# Patient Record
Sex: Female | Born: 1995 | Race: White | Hispanic: No | Marital: Single | Smoking: Never smoker
Health system: Southern US, Community
[De-identification: ages and names within clinical notes are randomized; demographics above are authoritative.]

## PROBLEM LIST (undated history)

## (undated) DIAGNOSIS — F1010 Alcohol abuse, uncomplicated: Secondary | ICD-10-CM

## (undated) DIAGNOSIS — F319 Bipolar disorder, unspecified: Secondary | ICD-10-CM

## (undated) HISTORY — DX: Bipolar disorder, unspecified: F31.9

## (undated) HISTORY — DX: Alcohol abuse, uncomplicated: F10.10

---
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
## 2013-01-26 ENCOUNTER — Encounter: Payer: Self-pay | Admitting: Internal Medicine

## 2013-01-28 ENCOUNTER — Ambulatory Visit (INDEPENDENT_AMBULATORY_CARE_PROVIDER_SITE_OTHER): Payer: No Typology Code available for payment source | Admitting: Adult Health

## 2013-01-28 ENCOUNTER — Encounter: Payer: Self-pay | Admitting: Adult Health

## 2013-01-28 VITALS — BP 110/62 | HR 72 | Temp 98.3°F | Resp 12 | Wt 123.0 lb

## 2013-01-28 DIAGNOSIS — R05 Cough: Secondary | ICD-10-CM

## 2013-01-28 DIAGNOSIS — R059 Cough, unspecified: Secondary | ICD-10-CM | POA: Insufficient documentation

## 2013-01-28 MED ORDER — ALBUTEROL SULFATE HFA 108 (90 BASE) MCG/ACT IN AERS: 2.0000 | INHALATION_SPRAY | Freq: Four times a day (QID) | RESPIRATORY_TRACT | Status: AC | PRN

## 2013-01-28 MED ORDER — FEXOFENADINE HCL 30 MG/5ML PO SUSP: ORAL | Status: AC

## 2013-01-28 MED ORDER — GUAIFENESIN-CODEINE 100-10 MG/5ML PO SYRP: 5.0000 mL | ORAL_SOLUTION | Freq: Three times a day (TID) | ORAL | Status: AC | PRN

## 2013-01-28 NOTE — Assessment & Plan Note (Signed)
Robitussin AC 1 tsp 3 times/daily; Allegra elixir 60 mg bid; Albuterol inhaler q 6 hours prn for wheezing, cough or sob.

## 2013-01-28 NOTE — Patient Instructions (Signed)
  I am ordering Robitussin AC (codeine) for severe cough. You can take this 3 times a day.   Use the inhaler every 6 hours for the next 2 days then just do the inhaler as needed for cough, shortness of breath or wheezing.  Take allegra 2 teaspoons (60 mg) twice a day.   If your symptoms are not better by Monday, call the office Tuesday morning to try to get an appointment to be seen.

## 2013-01-28 NOTE — Progress Notes (Signed)
  Subjective:    Patient ID: Kristy Robertson, female    DOB: [birth year], 17 y.o.   MRN: [id number]  HPI  Patient presents with cough x 4-5 days, worse at night. Patient reports that she was recently started on Lamictal and she knows that this has a side effect of increased cough. She also reports that she was prescribed an inhaler when she was younger for "just in case". 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  Patient has been taking children's benadryl and this seems to calm it down. She reports her cough has never been this bad.   Current Outpatient Prescriptions on File Prior to Visit  Medication Sig Dispense Refill  . lamoTRIgine (LAMICTAL) 25 MG tablet Take 1 tablet (25 mg total) by mouth daily. One tablet daily in the evening,  Increase dose weekly by 25 mg  120 tablet  1  . SUMAtriptan (IMITREX) 50 MG tablet Take 1 tab by mouth at onset pf migraine.  9 tablet  5   No current facility-administered medications on file prior to visit.     Review of Systems  Constitutional: Negative for fever and chills.  Respiratory: Positive for cough.      BP 110/62  Pulse 72  Temp(Src) 98.3 F (36.8 C) (Oral)  Resp 12  Wt 123 lb (55.792 kg)  BMI 21.29 kg/m2  SpO2 98%    Objective:   Physical Exam  Constitutional: She is oriented to person, place, and time. She appears well-developed and well-nourished. No distress.  HENT:  Head: Normocephalic and atraumatic.  Right Ear: External ear normal.  Left Ear: External ear normal.  Scant posterior pharyngeal drainage  Cardiovascular: Normal rate, regular rhythm and normal heart sounds.  Exam reveals no gallop and no friction rub.   No murmur heard. Pulmonary/Chest: Effort normal and breath sounds normal. No respiratory distress. She has no wheezes. She has no rales.  Neurological: She is alert and oriented to person, place, and time.  Skin: Skin is warm and dry.  Psychiatric: She has a normal mood and affect. Her behavior is  normal. Judgment and thought content normal.          Assessment & Plan:

## 2013-02-23 ENCOUNTER — Other Ambulatory Visit: Payer: Self-pay | Admitting: Internal Medicine

## 2013-02-23 NOTE — Telephone Encounter (Signed)
Find out what dose of lamictal hse is currently taking  The pill is 25 but was supposed to be titrated up to 100 mg over time.

## 2013-02-23 NOTE — Telephone Encounter (Signed)
Patient stated she is taking 50 mg .

## 2013-02-23 NOTE — Telephone Encounter (Signed)
Left message for pt to return my call.

## 2013-02-23 NOTE — Telephone Encounter (Signed)
Refill

## 2013-03-03 MED ORDER — LAMOTRIGINE 25 MG PO TABS: 50.0000 mg | ORAL_TABLET | Freq: Every day | ORAL | Status: DC

## 2013-03-03 NOTE — Addendum Note (Signed)
Addended by: Sherlene Shams on: 03/03/2013 06:48 AM   Modules accepted: Orders

## 2013-04-13 ENCOUNTER — Encounter: Payer: Self-pay | Admitting: Internal Medicine

## 2013-05-07 ENCOUNTER — Encounter: Payer: Self-pay | Admitting: Internal Medicine

## 2013-05-08 MED ORDER — LAMOTRIGINE 25 MG PO TABS: 50.0000 mg | ORAL_TABLET | Freq: Every day | ORAL | Status: AC

## 2013-06-13 ENCOUNTER — Other Ambulatory Visit: Payer: Self-pay | Admitting: Internal Medicine

## 2013-06-15 NOTE — Telephone Encounter (Signed)
Eprescribed.

## 2013-07-16 ENCOUNTER — Other Ambulatory Visit: Payer: Self-pay

## 2013-08-18 ENCOUNTER — Encounter: Payer: Self-pay | Admitting: Internal Medicine

## 2013-08-18 ENCOUNTER — Ambulatory Visit (INDEPENDENT_AMBULATORY_CARE_PROVIDER_SITE_OTHER): Payer: No Typology Code available for payment source | Admitting: Internal Medicine

## 2013-08-18 VITALS — BP 110/68 | HR 113 | Temp 99.4°F | Resp 14 | Wt 121.5 lb

## 2013-08-18 DIAGNOSIS — J029 Acute pharyngitis, unspecified: Secondary | ICD-10-CM | POA: Insufficient documentation

## 2013-08-18 DIAGNOSIS — Z79899 Other long term (current) drug therapy: Secondary | ICD-10-CM

## 2013-08-18 DIAGNOSIS — F429 Obsessive-compulsive disorder, unspecified: Secondary | ICD-10-CM

## 2013-08-18 LAB — POCT INFLUENZA A/B: Influenza B, POC: NEGATIVE

## 2013-08-18 MED ORDER — AMOXICILLIN-POT CLAVULANATE 400-57 MG/5ML PO SUSR: 800.0000 mg | Freq: Two times a day (BID) | ORAL | Status: AC

## 2013-08-18 NOTE — Progress Notes (Signed)
Patient ID: Kristy Robertson, female   DOB: 1996/08/30, 17 y.o.   MRN: [id number]  Patient Active Problem List   Diagnosis Date Noted  . Alcohol abuse   . Acute pharyngitis 08/18/2013  . Cough 01/28/2013  . Depression 11/26/2012  . OCD (obsessive compulsive disorder) 04/08/2012    Subjective:  CC:   Chief Complaint  Patient presents with  . Sore Throat     taking ibuprophen last night febrile with temp 101 and at lunch today  . Sinusitis    pressure  . Chills  . Otalgia    HPI:   Kristy Robertson a 17 y.o. female who presents Subjective Fevers, chills myalgias and short throat   for the last several days.  Patient  accompanied  by her mother.  Both are concerned that her current symtoms are a reaction to the Lamictal which has been taking for several months for bipolar disorder.   Past Medical History  Diagnosis Date  . Bipolar disorder   . Alcohol abuse     History reviewed. No pertinent past surgical history.     The following portions of the patient's history were reviewed and updated as appropriate: Allergies, current medications, and problem list.    Review of Systems:  Patient denies, unintentional weight loss,  eye pain,  dysphagia,  hemoptysis , cough, dyspnea, wheezing, chest pain, palpitations, orthopnea, edema, abdominal pain, nausea, melena, diarrhea, constipation, flank pain, dysuria, hematuria, urinary  Frequency, nocturia, numbness, tingling, seizures,  Focal weakness, Loss of consciousness,  Tremor, insomnia, depression, anxiety, and suicidal ideation.     History   Social History  . Marital Status: Single    Spouse Name: N/A    Number of Children: N/A  . Years of Education: N/A   Occupational History  . Not on file.   Social History Main Topics  . Smoking status: Never Smoker   . Smokeless tobacco: Never Used  . Alcohol Use: Yes  . Drug Use: No  . Sexual Activity: Not Currently   Other Topics Concern  . Not on file   Social History  Narrative  . No narrative on file    Objective:  Filed Vitals:   08/18/13 1430  BP: 110/68  Pulse: 113  Temp: 99.4 F (37.4 C)  Resp: 14     General appearance: alert, cooperative and appears stated age Ears: normal TM's and external ear canals both ears. TM bilaterally erythematous. bialteral maxiallary tenderness.  Throat: lips, mucosa, and tongue normal; teeth and gums normal, some tonsillar erythema without ulcerations.  no mucocutaneous lesions Neck: mild cervical LAD, no carotid bruit, supple, symmetrical, trachea midline and thyroid not enlarged, symmetric, no tenderness/mass/nodules Back: symmetric, no curvature. ROM normal. No CVA tenderness. Lungs: clear to auscultation bilaterally Heart: regular rate and rhythm, S1, S2 normal, no murmur, click, rub or gallop Abdomen: soft, non-tender; bowel sounds normal; no masses,  no organomegaly Pulses: 2+ and symmetric Skin: Skin color, texture, turgor normal. No rashes or lesions Lymph nodes: Cervical, supraclavicular, and axillary nodes normal.  Assessment and Plan:  Acute pharyngitis Rapid Strep test and influenza test were negative. However given her concurrent sinusitis and otitis and acute pharyngitis with, recommend we treat empirically with Augmentin along with decongestants and antihistamines. She has no evidence of a type of mucosal lesions or rash that is hallmarked of Stevens-Johnson's syndrome which can occur with Lamictal use. However I did order CBC the patient refused to have the blood work done today. The following day patient's mother called  back at 4:45 PM stating the patient had developed a rash on her hand and she was again concerned about Stevens-Johnson syndrome secondary to use of Lamictal.. She was instructed to stop both the amoxicillin and the Lamictal and go to the ER for further evaluation which would require blood work..  OCD (obsessive compulsive disorder) Currently under management by Dr. Tamsen Roers  previously with sertralin, now with Lamictal. Patient's recovery is  complicated by  Ongoing alcohol abuse and adolescent behavior relatively speaking.   Updated Medication List Outpatient Encounter Prescriptions as of 08/18/2013  Medication Sig  . albuterol (PROVENTIL HFA;VENTOLIN HFA) 108 (90 BASE) MCG/ACT inhaler Inhale 2 puffs into the lungs every 6 (six) hours as needed for wheezing.  Marland Kitchen amoxicillin-clavulanate (AUGMENTIN) 400-57 MG/5ML suspension Take 10 mLs (800 mg total) by mouth 2 (two) times daily.  . diphenhydrAMINE (BENADRYL) 12.5 MG/5ML elixir Take by mouth 4 (four) times daily as needed for allergies.  . fexofenadine (ALLEGRA) 30 MG/5ML suspension Take 2 teaspoons in the morning and 2 teaspoons in the evening. Do not exceed 180 mg daily.  Marland Kitchen guaiFENesin-codeine (ROBITUSSIN AC) 100-10 MG/5ML syrup Take 5 mLs by mouth 3 (three) times daily as needed for cough.  . lamoTRIgine (LAMICTAL) 25 MG tablet Take 2 tablets (50 mg total) by mouth daily.  . SUMAtriptan (IMITREX) 50 MG tablet TAKE 1 TABLET BY MOUTH AT ONSET OF MIGRAINE.     Orders Placed This Encounter  Procedures  . CBC with Differential  . POCT Influenza A/B  . POCT rapid strep A    No Follow-up on file.

## 2013-08-18 NOTE — Patient Instructions (Signed)
You have a sinus/ear infection   .  I am prescribing an antibiotic  I also advise use of the following OTC meds to help with your other symptoms.   Take generic OTC benadryl 25 mg every 8 hours for the drainage,  Sudafed PE  10 to 30 mg every 8 hours for the congestion, you may substitute Afrin nasal spray for the nighttime dose of sudafed PE  If needed to prevent insomnia.  Gargle with salt water as needed for sore throat.   Use ibuprofen and tylenol for aches pains and fevers  Please take a probiotic ( Align, Floraque or Culturelle) while you are on the antibiotic to prevent a serious antibiotic associated diarrhea  Called clostirudium dificile colitis and a vaginal yeast infection

## 2013-08-19 ENCOUNTER — Telehealth: Payer: Self-pay | Admitting: Internal Medicine

## 2013-08-19 NOTE — Telephone Encounter (Signed)
Pt mother calling.  States Dr. Maryruth Bun told her to call us immediately.  Pt is having a reaction to either amoxicillin or Lamictal.  States Dr. Maryruth Bun wants [place] to determine whether the amoxicillin could be causing pt to have a severe headache and a rash on her hand.  Mother would like a call urgently as Dr. Maryruth Bun told her if it is not the amoxicillin and is a reaction to Lamictal it could be very serious.  Please contact mother today.

## 2013-08-19 NOTE — Telephone Encounter (Signed)
Notified patient mother to stop medication and take patient to ER

## 2013-08-19 NOTE — Telephone Encounter (Signed)
I cannot determine whether her reaction is due to the lamictal or the amoxicillin without seeing her and without blood work.  I recommend stopping both and going to the ER<

## 2013-08-20 ENCOUNTER — Encounter: Payer: Self-pay | Admitting: Internal Medicine

## 2013-08-20 DIAGNOSIS — F319 Bipolar disorder, unspecified: Secondary | ICD-10-CM | POA: Insufficient documentation

## 2013-08-20 DIAGNOSIS — F1010 Alcohol abuse, uncomplicated: Secondary | ICD-10-CM | POA: Insufficient documentation

## 2013-08-20 NOTE — Assessment & Plan Note (Addendum)
Currently under management by Dr. Tamsen Roers previously with sertralin, now with Lamictal. Patient's recovery is  complicated by  Ongoing alcohol abuse and adolescent behavior relatively speaking.

## 2013-08-20 NOTE — Assessment & Plan Note (Addendum)
Rapid Strep test and influenza test were negative. However given her concurrent sinusitis and otitis and acute pharyngitis with, recommend we treat empirically with Augmentin along with decongestants and antihistamines. She has no evidence of a type of mucosal lesions or rash that is hallmarked of Stevens-Johnson's syndrome which can occur with Lamictal use. However I did order CBC the patient refused to have the blood work done today. The following day patient's mother called back at 4:45 PM stating the patient had developed a rash on her hand and she was again concerned about Stevens-Johnson syndrome secondary to use of Lamictal.. She was instructed to stop both the amoxicillin and the Lamictal and go to the ER for further evaluation which would require blood work.Marland Kitchen

## 2013-09-14 ENCOUNTER — Other Ambulatory Visit: Payer: Self-pay | Admitting: *Deleted

## 2013-09-14 MED ORDER — SUMATRIPTAN SUCCINATE 50 MG PO TABS: ORAL_TABLET | ORAL | Status: DC

## 2013-10-20 ENCOUNTER — Other Ambulatory Visit: Payer: Self-pay | Admitting: *Deleted

## 2013-10-20 MED ORDER — SUMATRIPTAN SUCCINATE 50 MG PO TABS: ORAL_TABLET | ORAL | Status: DC

## 2013-10-29 ENCOUNTER — Other Ambulatory Visit: Payer: Self-pay | Admitting: *Deleted

## 2013-11-16 ENCOUNTER — Ambulatory Visit: Payer: Self-pay | Admitting: Adult Health

## 2013-12-03 ENCOUNTER — Other Ambulatory Visit: Payer: Self-pay | Admitting: Internal Medicine

## 2013-12-29 ENCOUNTER — Other Ambulatory Visit: Payer: Self-pay | Admitting: Internal Medicine

## 2014-03-05 ENCOUNTER — Other Ambulatory Visit: Payer: Self-pay | Admitting: Internal Medicine

## 2014-03-08 NOTE — Telephone Encounter (Signed)
Last OV 3.9.15, last refill 5.21.15.  Please advise refill.

## 2014-03-09 NOTE — Telephone Encounter (Signed)
Ok to refill,  Refill sent  

## 2014-05-13 LAB — HM PAP SMEAR: HM Pap smear: NORMAL

## 2014-06-18 ENCOUNTER — Other Ambulatory Visit: Payer: Self-pay | Admitting: Internal Medicine

## 2014-08-25 ENCOUNTER — Other Ambulatory Visit: Payer: Self-pay | Admitting: Internal Medicine

## 2014-09-03 ENCOUNTER — Emergency Department: Payer: Self-pay | Admitting: Internal Medicine

## 2014-09-04 ENCOUNTER — Ambulatory Visit: Payer: Self-pay | Admitting: Orthopedic Surgery

## 2015-01-01 NOTE — Consult Note (Signed)
PATIENT NAME:  Kristy Robertson, Kristy Robertson MR#:  [id number] DATE OF BIRTH:  [birth year]  DATE OF CONSULTATION:  09/03/2014  REFERRING PHYSICIAN:   CONSULTING PHYSICIAN:  Ruthe Mannan, MD  PHYSICIAN REQUESTING CONSULTATION:  Dr. Glennie Isle in the Emergency Department.   CHIEF COMPLAINT: Right wrist pain.   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 She presented for evaluation. She says the pain is located over the wrist, it is made worse with any movement. She denies any numbness or tingling or radiation down to the fingers. She has had no prior injuries. No prior surgery.   MEDICATIONS:   1.  Lamictal.  2.  Imitrex, as needed.   For full details, please see electronic medical record.   ALLERGIES: None.   PAST SURGICAL HISTORY: :  None.   SOCIAL HISTORY: The patient does not drink or smoke. She attends [employer]. She is here with her mother and father.   REVIEW OF SYSTEMS: CONSTITUTIONAL:  No weight loss, fever, chills, weakness or fatigue. HEENT:  Eyes:  No visual loss, blurred vision, double vision or yellow sclerae. Ears, Nose, Throat:  No hearing loss, sneezing, congestion, runny nose or sore throat. SKIN:  No rash or itching. CARDIOVASCULAR:  No chest pain, chest pressure or chest discomfort. No palpitations or edema. RESPIRATORY:  No shortness of breath, cough or sputum. GASTROINTESTINAL:  No anorexia, nausea, vomiting or diarrhea. No abdominal pain or blood. GENITOURINARY:  No burning on urination. Last menstrual period, MM/DD/YYYY. NEUROLOGICAL:  No headache, dizziness, syncope, paralysis, ataxia, numbness or tingling in the extremities. No change in bowel or bladder control. MUSCULOSKELETAL:  Per HPI HEMATOLOGIC:  No anemia, bleeding or bruising. LYMPHATICS:  No  enlarged nodes. No history of splenectomy. PSYCHIATRIC:  Reports anxetu ENDOCRINOLOGIC:  No reports of sweating, cold or heat intolerance. No polyuria or polydipsia. ALLERGIES:  No history of asthma, hives, eczema or rhinitis.   PHYSICAL EXAM:  VITAL SIGNS: Stable.  GENERAL:  She is a well-nourished, well-developed female in no apparent distress.  HEAD: Atraumatic.  NECK: Supple.  HEART: Regular rate. No arrythmias. Extremities are well perfused. LUNGS: Clear to auscutation ABDOMEN: Soft.  RIGHT UPPER EXTREMITY: Right upper extremity fingertips are warm and well perfused. Intact sensation throughout the fingertips. There is a radially deviated and dorsally angulated deformity of the right wrist. Tender to palpation over the right wrist. She has limited range of motion due to pain.   X-RAYS:  X-rays AP, lateral, and oblique views demonstrate extra-articular fracture of the distal radius, with associated styloid fracture. There is dorsal comminution of the distal radius fracture. She is 25 degrees angulated dorsally (35 degrees total angulation). There is dorsal comminution.   DISCUSSION: I had a long discussion with Kristy Robertson and her parents. She has several features of the fracture which would indicate that it has features of instability, including the dorsal comminution, and the initial angulation of 35 degrees. I had a discussion with her about a closed reduction, she has some anxiety and is very reluctant to undergo a closed reduction. In addition, she does not want to have multiple clinic visits, with the likelihood of still having loss of reduction requiring surgery. For these reasons, the patient chose for operative treatment of this distal radius fracture. I think that this  is a reasonable choice given her age, functional demand, and the fracture geometry. We will plan for an open reduction and internal fixation of the right distal radius tomorrow. She has been given n.p.o. instructions, and  has her phone on and will be available for coming in for surgery.   ASSESSMENT: A 19 year old female with right distal radius fracture, unstable. Plan for open reduction internal fixation tomorrow.    30 minutes were spent on this consultation, with 28 of those minutes spent face-to-face counseling, discussing the diagnosis, and advising regarding operative versus nonoperative choices.    ____________________________ Ruthe MannanPeter Dorinne Graeff, MD pa:bu D: 09/03/2014 17:13:32 ET T: 09/03/2014 19:09:29 ET JOB#: [id number]  cc: Ruthe MannanPeter Leonia Heatherly, MD, <Dictator> Althea GrimmerPeter J. Mickle PlumbApel, MD PhD Orthopaedic Surgery ELECTRONICALLY SIGNED 09/04/2014 13:17

## 2015-01-01 NOTE — Op Note (Signed)
PATIENT NAME:  Kristy, Robertson MR#:  [id number] DATE OF BIRTH:  [birth year]  DATE OF PROCEDURE:  09/04/2014  PREOPERATIVE DIAGNOSIS: Right extra-articular distal radius fracture.   POSTOPERATIVE DIAGNOSIS:  Right extra-articular distal radius fracture.  PROCEDURE: Open reduction with internal fixation of right extra-articular distal radius fracture. CPT code 81191.   SURGEON: Ruthe Mannan, M.D.   ASSISTANT: None.   IMPLANTS: [employer] DVR anatomic plate three hole, narrow.   SPECIMENS: None.   OPERATIVE FINDINGS:  1.  There was some plastic deformation of the radius, more common pediatric type fractures.  2.  There is buttonholing of the distal radius through the pronator quadratus.  3.  There was extensive damage to the pronator quadratus.  4.  We got the anatomic reduction of the radial length, inclination and volar tilt.  5.  The fracture was stable following open reduction and internal fixation.   FLUIDS: Per anesthesia record.   ESTIMATED BLOOD LOSS: 10 mL.   TOURNIQUET TIME: 52 minutes.   INDICATIONS: Kristy Robertson is a 19 year old female who fell off of a skateboard. She suffered the above-listed injury. She had several features that would indicate instability based on her preoperative x-ray.  In addition, she very much did not want to have a closed reduction in the Emergency Department. We had a long discussion regarding the operative rationale of distal radius fractures.   DISCUSSION:  After a lengthy discussion she wished to proceed with open reduction and internal fixation.   DETAILED DESCRIPTION OF PROCEDURE: In supine position, right upper extremity was prepped and draped in usual sterile fashion. A timeout was performed. The antibiotics were given. The limb was exsanguinated and the tourniquet was elevated.   The standard trans-FCR approach to the distal radius was performed. Incision was made in the skin, dissection was taken down through the skin and subcutaneous  tissue. FCR tendon was identified and retracted. Sub-sheath was entered and palmar cutaneous nerve was identified and protected.  The FPL was then swept off the distal radius to expose the pronator quadratus. At that point we saw button-holing of the radial shaft to the pronator quadratus. Pronator quadratus was elevated off the radial shaft and the hockey-stick incision in the quadratus was completed to allow for ulnarward retraction. Following this we had excellent exposure of the distal radius. Subperiosteal exposure the distal radius was obtained. Brachial radialis was released off of the radial styloid fragment.   A provisional reduction was then performed with longitudinal traction, volar flexion and a 0.062 inch K wire placed in the radial styloid. Following the placement of this pin we had enough provisional reduction to select a plate of appropriate size, position it using fluoroscopy and provisionally affix it to the radius. Position was checked in the AP and lateral fluoroscopic views, we then attached the shaft of the above-mentioned plate to the radial shaft. We then removed the aforementioned K wire, flexed and pulled traction on the wrist to get the extra-articular fragment to reduce to the plate. We then placed smooth pegs in the distal plate. We purposely left the pegs 2 mm short of the measured length to avoid perforation of the dorsa cortex. Position was checked in AP and lateral fluoroscopic view. We are pleased with the placement of the plate and pegs and reduction. We then completed the placement of distal locking pegs, tightened all the screws in the shaft and took the wrist through a range of motion and it was found to be stable.   Next, attention  was directed to testing the DRUJ. We then tested the DRUJ stability in full supination, neutral and pronation. She had hypolaxity of the DRUJ, but with a firm endpoint. This was symmetric to the contralateral side, which I did examine before,  and then again, after surgery. Satisfied that she did not have any DRUJ instability, we then closed the wound in a layered fashion. We irrigated, and the pronator quadratus was closed with a 2-0 Vicryl, subcutaneous layers were brought together with a 3-0 Vicryl, and the skin was closed with a 4-0 nylon. Steri-Strips and a well padded volar splint was then applied. The tourniquet was deflated. The patient was then awakened and taken to recovery in stable condition.   POSTOPERATIVE PLAN:  The patient wishes to follow up with Dr. Juanell FairlyKevin Krasinski in two weeks. At that time, she should have her splint removed, repeat radiograph performed, and began a program of active and passive range of motion of the wrist."   ____________________________ Ruthe MannanPeter Jazion Atteberry, MD pa:at D: 09/05/2014 00:07:14 ET T: 09/05/2014 09:04:42 ET JOB#: [id number]  cc: Ruthe MannanPeter Patches Mcdonnell, MD, <Dictator> Althea GrimmerPeter J. Mickle PlumbApel, MD PhD Orthopaedic Surgery ELECTRONICALLY SIGNED 09/05/2014 17:49

## 2015-01-01 NOTE — Consult Note (Signed)
Brief Consult Note: Diagnosis: Right distal radius fracture.   Patient was seen by consultant.   Consult note dictated.   Recommend to proceed with surgery or procedure.   Comments: 19 year old female with 35 degree displaced DRF with dorsal communution. Likely to be unstable with  R and cast treatment. Patient does not want a closed reduction (due to pain and anxiety) and wants to proceed with surgical treatment (which I think is reasonable given the fracture geometry and her functional demands). Plan for ORIF tomorrow.  Electronic Signatures: Ruthe MannanApel, Kenneth Cuaresma (MD)  (Signed 25-Dec-15 17:15)  Authored: Brief Consult Note   Last Updated: 25-Dec-15 17:15 by Ruthe MannanApel, Nolyn Eilert (MD)

## 2015-04-13 ENCOUNTER — Encounter: Payer: Self-pay | Admitting: Internal Medicine

## 2015-04-13 ENCOUNTER — Ambulatory Visit (INDEPENDENT_AMBULATORY_CARE_PROVIDER_SITE_OTHER): Payer: No Typology Code available for payment source | Admitting: Internal Medicine

## 2015-04-13 ENCOUNTER — Telehealth: Payer: Self-pay | Admitting: Internal Medicine

## 2015-04-13 VITALS — BP 118/80 | HR 95 | Temp 97.7°F | Ht 64.0 in | Wt 131.0 lb

## 2015-04-13 DIAGNOSIS — R5383 Other fatigue: Secondary | ICD-10-CM

## 2015-04-13 DIAGNOSIS — F1010 Alcohol abuse, uncomplicated: Secondary | ICD-10-CM

## 2015-04-13 DIAGNOSIS — Z113 Encounter for screening for infections with a predominantly sexual mode of transmission: Secondary | ICD-10-CM

## 2015-04-13 DIAGNOSIS — E785 Hyperlipidemia, unspecified: Secondary | ICD-10-CM | POA: Diagnosis not present

## 2015-04-13 DIAGNOSIS — Z79899 Other long term (current) drug therapy: Secondary | ICD-10-CM

## 2015-04-13 DIAGNOSIS — Z8669 Personal history of other diseases of the nervous system and sense organs: Secondary | ICD-10-CM | POA: Diagnosis not present

## 2015-04-13 DIAGNOSIS — F3111 Bipolar disorder, current episode manic without psychotic features, mild: Secondary | ICD-10-CM

## 2015-04-13 DIAGNOSIS — F3130 Bipolar disorder, current episode depressed, mild or moderate severity, unspecified: Secondary | ICD-10-CM

## 2015-04-13 LAB — COMPREHENSIVE METABOLIC PANEL
ALK PHOS: 75 U/L (ref 39–117)
ALT: 22 U/L (ref 0–35)
AST: 20 U/L (ref 0–37)
Albumin: 5 g/dL (ref 3.5–5.2)
BILIRUBIN TOTAL: 0.5 mg/dL (ref 0.2–1.2)
BUN: 13 mg/dL (ref 6–23)
CHLORIDE: 104 meq/L (ref 96–112)
CO2: 27 mEq/L (ref 19–32)
Calcium: 9.8 mg/dL (ref 8.4–10.5)
Creatinine, Ser: 0.76 mg/dL (ref 0.40–1.20)
GFR: 102.05 mL/min (ref >60.00)
Glucose, Bld: 95 mg/dL (ref 70–99)
Potassium: 3.9 mEq/L (ref 3.5–5.1)
Sodium: 140 mEq/L (ref 135–145)
Total Protein: 7.4 g/dL (ref 6.0–8.3)

## 2015-04-13 LAB — CBC WITH DIFFERENTIAL/PLATELET
BASOS PCT: 0.5 % (ref 0.0–3.0)
Basophils Absolute: 0 10*3/uL (ref 0.0–0.1)
EOS ABS: 0.1 10*3/uL (ref 0.0–0.7)
Eosinophils Relative: 1 % (ref 0.0–5.0)
HCT: 43.9 % (ref 36.0–46.0)
Hemoglobin: 14.9 g/dL (ref 12.0–15.0)
Lymphocytes Relative: 26.5 % (ref 12.0–46.0)
Lymphs Abs: 2.4 10*3/uL (ref 0.7–4.0)
MCHC: 34 g/dL (ref 30.0–36.0)
MCV: 91.3 fl (ref 78.0–100.0)
MONOS PCT: 8.3 % (ref 3.0–12.0)
Monocytes Absolute: 0.8 10*3/uL (ref 0.1–1.0)
NEUTROS ABS: 5.8 10*3/uL (ref 1.4–7.7)
NEUTROS PCT: 63.7 % (ref 43.0–77.0)
Platelets: 243 10*3/uL (ref 150.0–400.0)
RBC: 4.81 Mil/uL (ref 3.87–5.11)
RDW: 12.7 % (ref 11.5–15.5)
WBC: 9.1 10*3/uL (ref 4.0–10.5)

## 2015-04-13 LAB — LIPID PANEL
Cholesterol: 168 mg/dL (ref 0–200)
HDL: 68 mg/dL (ref >39.00)
LDL CALC: 87 mg/dL (ref 0–99)
NonHDL: 100.34
TRIGLYCERIDES: 65 mg/dL (ref 0.0–149.0)
Total CHOL/HDL Ratio: 2
VLDL: 13 mg/dL (ref 0.0–40.0)

## 2015-04-13 LAB — TSH: TSH: 2.07 u[IU]/mL (ref 0.35–4.50)

## 2015-04-13 MED ORDER — DOXYCYCLINE HYCLATE 100 MG PO CAPS: 100.0000 mg | ORAL_CAPSULE | Freq: Two times a day (BID) | ORAL | Status: AC

## 2015-04-13 MED ORDER — SUMATRIPTAN SUCCINATE 100 MG PO TABS: 100.0000 mg | ORAL_TABLET | Freq: Once | ORAL | Status: DC

## 2015-04-13 NOTE — Progress Notes (Signed)
Subjective:  Patient ID: Kristy Robertson, female    DOB: 1996/04/21  Age: 19 y.o. MRN: [id number]  CC: The primary encounter diagnosis was Hx of migraine headaches. Diagnoses of Screen for STD (sexually transmitted disease), Long-term use of high-risk medication, Other fatigue, Hyperlipidemia, Alcohol abuse, Bipolar disorder, most recent episode depressed, remission status unspecified, and Bipolar disorder, current episode manic without psychotic features, mild were also pertinent to this visit.  HPI [name] presents for FOLLOW UP ON chronic conditions,  Has not been seen since Dec 2014.  Seeing Dr Maryruth Bun for management of bipolar disorder complicated by alcohol abuse.   She has been reducing her alcohol use and raking her medications as directed.  Her academic progress has improved.   She suffered a compound fracture of the right wrist on  DEC 25TH , requiring surgery Dec 26th.  Her mobility has improved and her pain has resolved.    Has averaged one full migraine per month .  Has periods of time where she has a daily headache occurring at the end of the day and takes a "bite" of her imitrex almost daily during those periods to prevent a full blown migraine. She has been running out of the 50 mg dose ,  Wants to use the 100 mg   Outpatient Prescriptions Prior to Visit  Medication Sig Dispense Refill  . albuterol (PROVENTIL HFA;VENTOLIN HFA) 108 (90 BASE) MCG/ACT inhaler Inhale 2 puffs into the lungs every 6 (six) hours as needed for wheezing. 1 Inhaler 2  . diphenhydrAMINE (BENADRYL) 12.5 MG/5ML elixir Take by mouth 4 (four) times daily as needed for allergies.    . fexofenadine (ALLEGRA) 30 MG/5ML suspension Take 2 teaspoons in the morning and 2 teaspoons in the evening. Do not exceed 180 mg daily. 300 mL 12  . guaiFENesin-codeine (ROBITUSSIN AC) 100-10 MG/5ML syrup Take 5 mLs by mouth 3 (three) times daily as needed for cough. 120 mL 0  . lamoTRIgine (LAMICTAL) 25 MG tablet Take 2  tablets (50 mg total) by mouth daily. (Patient taking differently: Take 150 mg by mouth daily. ) 60 tablet 5  . SUMAtriptan (IMITREX) 50 MG tablet TAKE 1 TABLET AT ONSET OF MIGRAINE 9 tablet 0  . amoxicillin-clavulanate (AUGMENTIN) 400-57 MG/5ML suspension Take 10 mLs (800 mg total) by mouth 2 (two) times daily. (Patient not taking: Reported on 04/13/2015) 150 mL 0   No facility-administered medications prior to visit.    Review of Systems;  Patient denies headache, fevers, malaise, unintentional weight loss, skin rash, eye pain, sinus congestion and sinus pain, sore throat, dysphagia,  hemoptysis , cough, dyspnea, wheezing, chest pain, palpitations, orthopnea, edema, abdominal pain, nausea, melena, diarrhea, constipation, flank pain, dysuria, hematuria, urinary  Frequency, nocturia, numbness, tingling, seizures,  Focal weakness, Loss of consciousness,  Tremor, insomnia, depression, anxiety, and suicidal ideation.      Objective:  BP 118/80 mmHg  Pulse 95  Temp(Src) 97.7 F (36.5 C) (Oral)  Ht 5\' 4"  (1.626 m)  Wt 131 lb (59.421 kg)  BMI 22.47 kg/m2  SpO2 99%  LMP 04/02/2015 (Approximate)  BP Readings from Last 3 Encounters:  04/13/15 118/80  08/18/13 110/68  01/28/13 110/62    Wt Readings from Last 3 Encounters:  04/13/15 131 lb (59.421 kg)  08/18/13 121 lb 8 oz (55.112 kg) (38 %*, Z = -0.30)  01/28/13 123 lb (55.792 kg) (44 %*, Z = -0.15)   * Growth percentiles are based on CDC 2-20 Years data.  General appearance: alert, cooperative and appears stated age Ears: normal TM's and external ear canals both ears Throat: lips, mucosa, and tongue normal; teeth and gums normal Neck: no adenopathy, no carotid bruit, supple, symmetrical, trachea midline and thyroid not enlarged, symmetric, no tenderness/mass/nodules Back: symmetric, no curvature. ROM normal. No CVA tenderness. Lungs: clear to auscultation bilaterally Heart: regular rate and rhythm, S1, S2 normal, no murmur, click,  rub or gallop Abdomen: soft, non-tender; bowel sounds normal; no masses,  no organomegaly Pulses: 2+ and symmetric Skin: Skin color, texture, turgor normal. No rashes or lesions Lymph nodes: Cervical, supraclavicular, and axillary nodes normal.  No results found for: HGBA1C  Lab Results  Component Value Date   CREATININE 0.76 04/13/2015    Lab Results  Component Value Date   WBC 9.1 04/13/2015   HGB 14.9 04/13/2015   HCT 43.9 04/13/2015   PLT 243.0 04/13/2015   GLUCOSE 95 04/13/2015   CHOL 168 04/13/2015   TRIG 65.0 04/13/2015   HDL 68.00 04/13/2015   LDLCALC 87 04/13/2015   ALT 22 04/13/2015   AST 20 04/13/2015   NA 140 04/13/2015   K 3.9 04/13/2015   CL 104 04/13/2015   CREATININE 0.76 04/13/2015   BUN 13 04/13/2015   CO2 27 04/13/2015   TSH 2.07 04/13/2015    No results found.  Assessment & Plan:   Problem List Items Addressed This Visit      Unprioritized   Alcohol abuse    She reports improved control of her alcohol use.       RESOLVED: Bipolar disorder    Managed by Dr. Maryruth Bun.  She has been stable for month and is doing well in school.       Hx of migraine headaches - Primary   Bipolar disorder    Currently controlled with lamictal only.         Other Visit Diagnoses    Screen for STD (sexually transmitted disease)        Relevant Orders    Hepatitis C antibody (Completed)    HIV antibody (Completed)    Long-term use of high-risk medication        Relevant Orders    CBC with Differential/Platelet (Completed)    Comprehensive metabolic panel (Completed)    Other fatigue        Relevant Orders    TSH (Completed)    Hyperlipidemia        Relevant Orders    Lipid panel (Completed)       I have changed Ms. Friley's SUMAtriptan. I am also having her start on doxycycline. Additionally, I am having her maintain her diphenhydrAMINE, guaiFENesin-codeine, albuterol, fexofenadine, lamoTRIgine, and amoxicillin-clavulanate.  Meds ordered this  encounter  Medications  . SUMAtriptan (IMITREX) 100 MG tablet    Sig: Take 1 tablet (100 mg total) by mouth once. At onset of migraine    Dispense:  9 tablet    Refill:  11    PLEASE USE SAME FORMULA/GENERIC AS FOR PATIETNT'S FATHER,  RAY Lovins  . doxycycline (VIBRAMYCIN) 100 MG capsule    Sig: Take 1 capsule (100 mg total) by mouth 2 (two) times daily.    Dispense:  14 capsule    Refill:  0    Medications Discontinued During This Encounter  Medication Reason  . SUMAtriptan (IMITREX) 50 MG tablet Reorder    Follow-up: Return in about 1 week (around 04/20/2015).   Sherlene Shams, MD

## 2015-04-13 NOTE — Telephone Encounter (Signed)
Pt mother Amy Pellow called to ask is Dr. Darrick Huntsman really needs to have fasting lab done. Pt has eaton already today and has bad aniexty and wanted her mother to be with her. Amy stated that she can bring the pt back now to have blood work done but this would not be fasting. Please advise pt/msn

## 2015-04-13 NOTE — Patient Instructions (Addendum)
Save the prescription for doxycycline for any episode of fever ( T > 100.4) and bad headache to protect you against [facility] Spotted fever    Please take a probiotic ( Align, Floraque or Culturelle) or a  generic version of one of these for a minimum of 3 weeks to prevent a serious antibiotic associated diarrhea  Called clostridium dificile colitis    PLEASE return for fasting labs ASAP!

## 2015-04-13 NOTE — Telephone Encounter (Signed)
Spoke with pts mother.  Advised as long as there was 4 hours between meal and lab draw.  Verbalized understanding.

## 2015-04-13 NOTE — Progress Notes (Signed)
Pre visit review using our clinic review tool, if applicable. No additional management support is needed unless otherwise documented below in the visit note. 

## 2015-04-14 LAB — HIV ANTIBODY (ROUTINE TESTING W REFLEX): HIV 1&2 Ab, 4th Generation: NONREACTIVE

## 2015-04-15 LAB — HEPATITIS C ANTIBODY: HCV Ab: NEGATIVE

## 2015-04-16 DIAGNOSIS — F319 Bipolar disorder, unspecified: Secondary | ICD-10-CM | POA: Insufficient documentation

## 2015-04-16 NOTE — Assessment & Plan Note (Signed)
Managed by Dr. Maryruth Bun.  She has been stable for month and is doing well in school.

## 2015-04-16 NOTE — Assessment & Plan Note (Signed)
She reports improved control of her alcohol use.

## 2015-04-16 NOTE — Assessment & Plan Note (Signed)
Currently controlled with lamictal only.

## 2015-04-17 ENCOUNTER — Encounter: Payer: Self-pay | Admitting: Internal Medicine

## 2015-05-03 NOTE — Telephone Encounter (Signed)
Mailed patient her unread mychart message  

## 2015-09-19 IMAGING — CR DG WRIST COMPLETE 3+V*R*
1 series · 3 of 3 positions shown · non-contrast
Comparison: None

CLINICAL DATA: Wrist pain and deformity, fell from a hover board

EXAM:
RIGHT WRIST - COMPLETE 3+ VIEW

[Series 1: pa · 0.17mm/px · 3 of 3 slices shown]
[im 1/3]
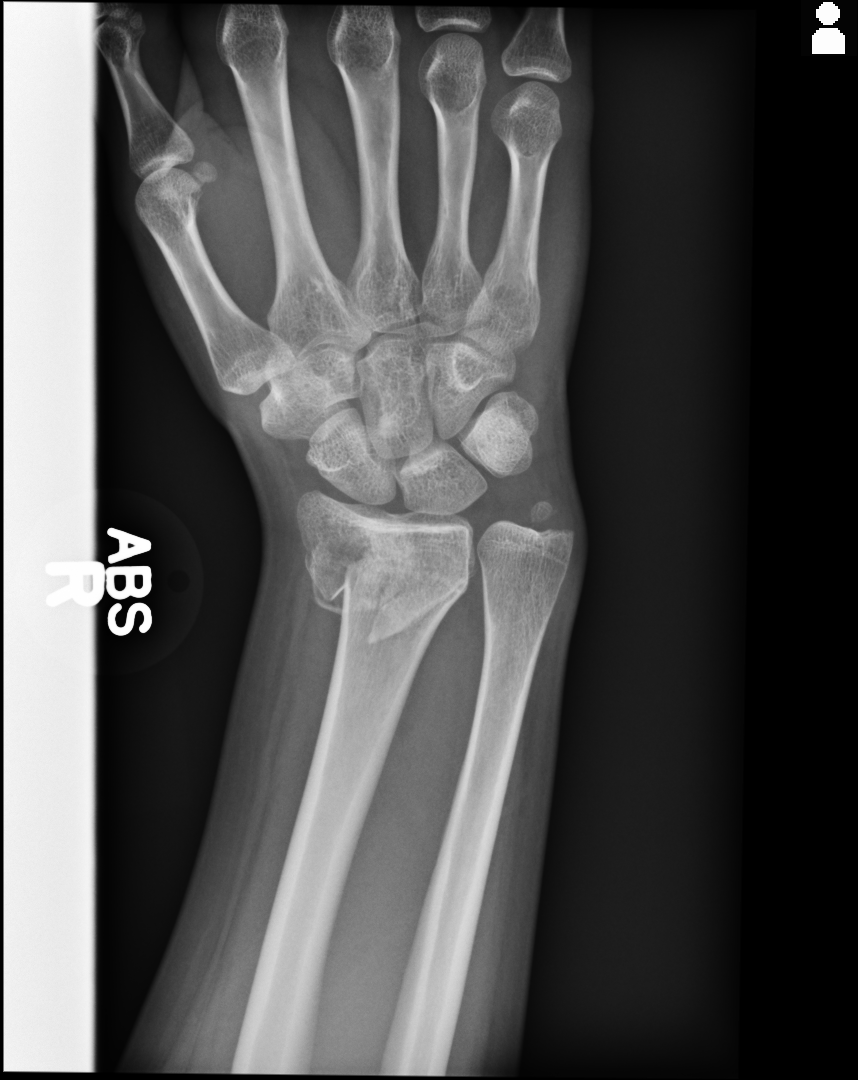
[im 2/3]
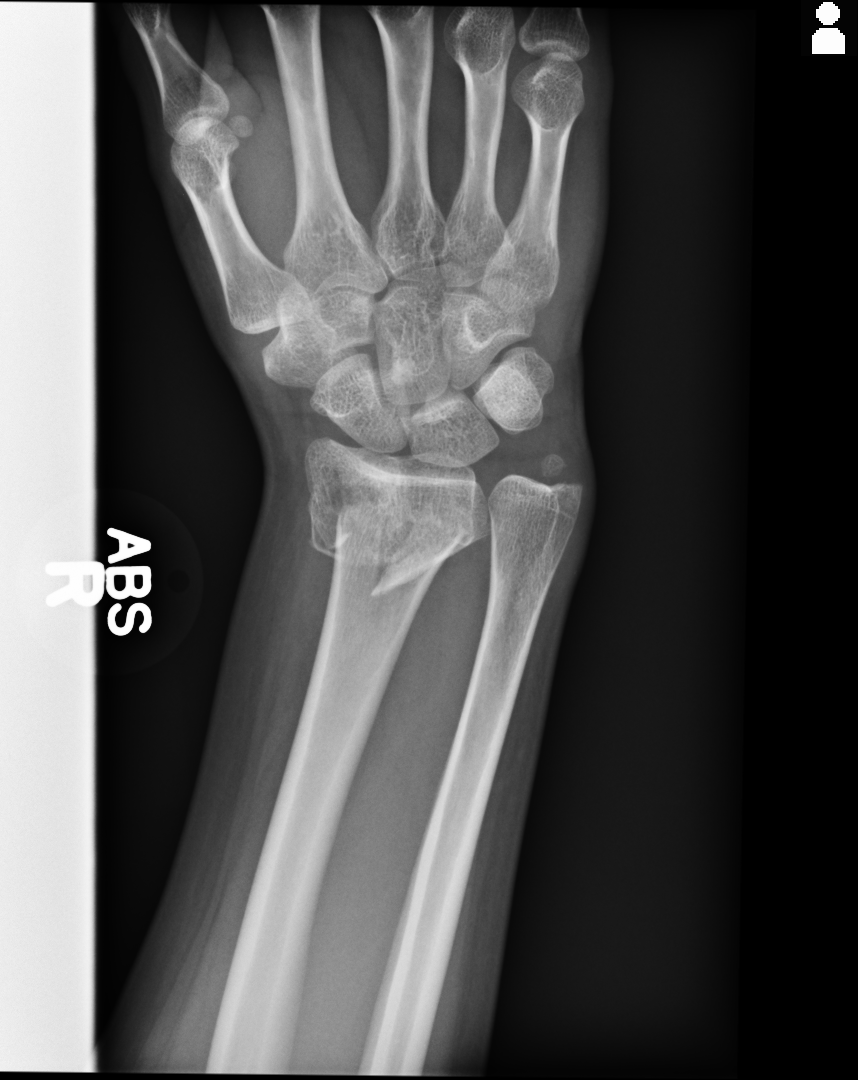
[im 3/3]
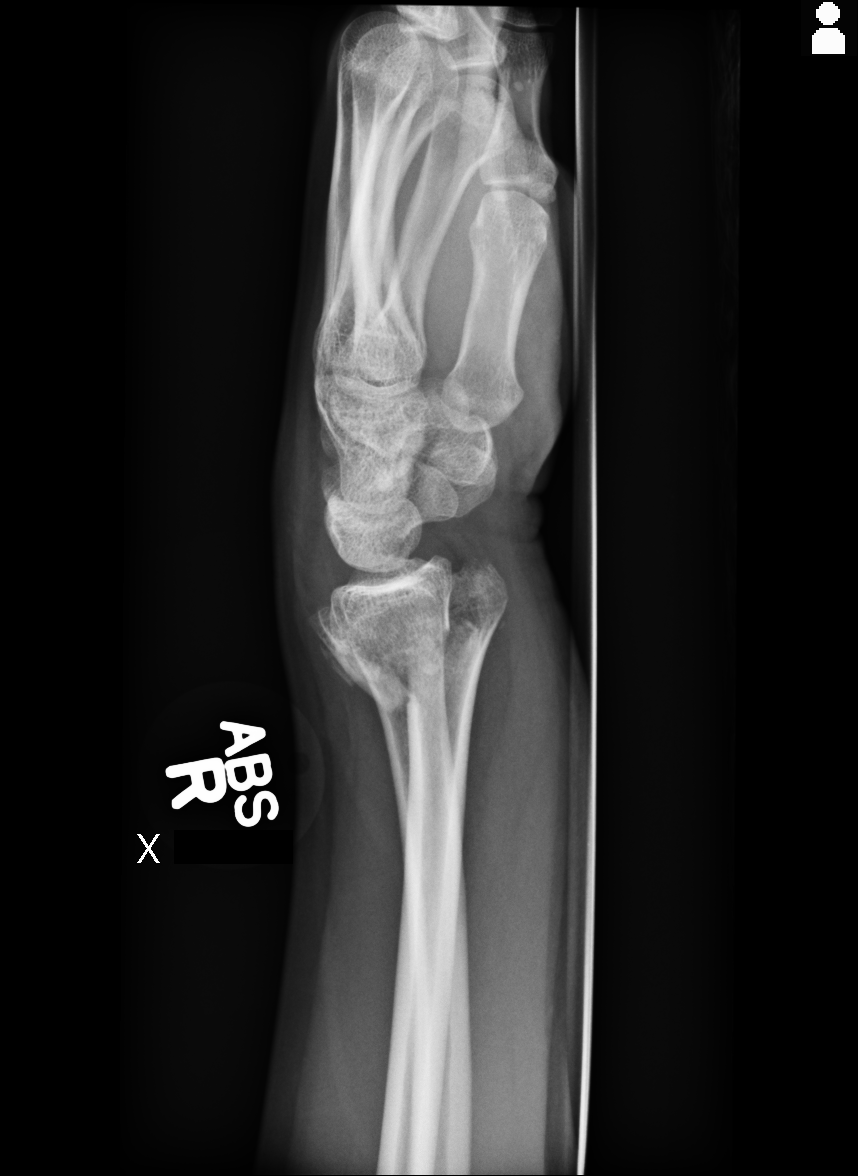

[3 of 3 positions shown; findings below may reference images not displayed]

FINDINGS: Comminuted displaced distal RIGHT radial metaphyseal fracture,
displaced dorsally and radially.

No definite intra-articular extension.

Displaced ulnar styloid fracture.

Dorsal tilt of distal radial articular surface.

Osseous mineralization normal.
IMPRESSION: Comminuted displaced distal radial metaphyseal fracture.

Displaced ulnar styloid fracture.

## 2015-11-10 ENCOUNTER — Other Ambulatory Visit: Payer: Self-pay | Admitting: Internal Medicine

## 2015-11-10 NOTE — Telephone Encounter (Signed)
Please advise, thanks.

## 2015-11-10 NOTE — Telephone Encounter (Signed)
Pt mom called about daughter going on a cruise on Monday 11/14/2015 and needs a antinausea patch. Pharmacy is [facility] - [place], [place] - [address] Call Amy mom @ [phone]. Thank you!

## 2015-11-11 MED ORDER — SCOPOLAMINE 1 MG/3DAYS TD PT72: 1.0000 | MEDICATED_PATCH | TRANSDERMAL | Status: AC

## 2015-11-11 NOTE — Telephone Encounter (Signed)
Notified pt's mom Rx was approved and sent to pharmacy

## 2015-11-11 NOTE — Telephone Encounter (Signed)
Pt mom called back to check the status to see if her daughter has to come in. Call Amy mom @ [phone]. Thank you!

## 2015-11-11 NOTE — Telephone Encounter (Signed)
Does not need appt.  Will send patch to pharmacy

## 2015-12-14 ENCOUNTER — Telehealth: Payer: Self-pay | Admitting: Internal Medicine

## 2015-12-14 ENCOUNTER — Encounter: Payer: Self-pay | Admitting: *Deleted

## 2015-12-14 NOTE — Telephone Encounter (Signed)
The note is for school, the [employer] per the mom.  She just needs a one line letter with the statement that she is under medical care for migraines. Thanks

## 2015-12-14 NOTE — Telephone Encounter (Signed)
Is it a school or work note?

## 2015-12-14 NOTE — Telephone Encounter (Signed)
Letter written and printed and faxed as requested.

## 2015-12-14 NOTE — Telephone Encounter (Signed)
Pt mom called about her daughter pt needing a note stating that she suffers with Migraine headaches and that she is being treated. Can it please be fax to [phone]. Call pt mom @ [phone]. Thank you!

## 2015-12-14 NOTE — Telephone Encounter (Signed)
Patient was last seen on 04/13/15 for migraines.  Is this okay to do? thanks

## 2016-01-24 ENCOUNTER — Other Ambulatory Visit: Payer: Self-pay | Admitting: Internal Medicine

## 2016-01-25 NOTE — Telephone Encounter (Signed)
Last OV 8/16 ok to refill Imetrix.

## 2016-01-26 NOTE — Telephone Encounter (Signed)
Refilled,  Needs august visit

## 2016-03-30 ENCOUNTER — Other Ambulatory Visit: Payer: Self-pay | Admitting: Internal Medicine

## 2016-04-20 ENCOUNTER — Other Ambulatory Visit: Payer: Self-pay | Admitting: Internal Medicine

## 2016-04-20 NOTE — Telephone Encounter (Signed)
Received refill request for SUMAtriptan (IMITREX) 100 MG tablet. Last office visit: 04/13/2015 and last refill: 04/01/2016 for 9 tablets. Please advise. Thanks

## 2016-04-22 NOTE — Telephone Encounter (Signed)
Refill for 30 days only.  OFFICE VISIT NEEDED prior to any more refills 

## 2016-05-16 ENCOUNTER — Other Ambulatory Visit: Payer: Self-pay | Admitting: Internal Medicine

## 2016-06-12 ENCOUNTER — Other Ambulatory Visit: Payer: Self-pay | Admitting: Internal Medicine

## 2016-06-14 NOTE — Telephone Encounter (Signed)
Tablets were given on 04/22/16 with a note that an appt is needed prior to anymore refills. No appt was ever made.

## 2016-08-13 ENCOUNTER — Other Ambulatory Visit: Payer: Self-pay | Admitting: Internal Medicine

## 2016-08-14 NOTE — Telephone Encounter (Signed)
Last OV was 04/13/2015, last refill was on 06/15/2016.  Please advise a refill.  No upcoming appt. thanks

## 2022-12-19 ENCOUNTER — Encounter: Payer: Self-pay | Admitting: Dermatology

## 2022-12-19 ENCOUNTER — Ambulatory Visit: Payer: 59 | Admitting: Dermatology

## 2022-12-19 VITALS — BP 117/83 | HR 104

## 2022-12-19 DIAGNOSIS — D225 Melanocytic nevi of trunk: Secondary | ICD-10-CM | POA: Diagnosis not present

## 2022-12-19 DIAGNOSIS — D2271 Melanocytic nevi of right lower limb, including hip: Secondary | ICD-10-CM | POA: Diagnosis not present

## 2022-12-19 DIAGNOSIS — L814 Other melanin hyperpigmentation: Secondary | ICD-10-CM | POA: Diagnosis not present

## 2022-12-19 DIAGNOSIS — D2262 Melanocytic nevi of left upper limb, including shoulder: Secondary | ICD-10-CM

## 2022-12-19 DIAGNOSIS — D1801 Hemangioma of skin and subcutaneous tissue: Secondary | ICD-10-CM

## 2022-12-19 DIAGNOSIS — D229 Melanocytic nevi, unspecified: Secondary | ICD-10-CM

## 2022-12-19 DIAGNOSIS — Q825 Congenital non-neoplastic nevus: Secondary | ICD-10-CM

## 2022-12-19 NOTE — Patient Instructions (Addendum)
Recommend daily broad spectrum sunscreen SPF 30+ to sun-exposed areas, reapply every 2 hours as needed. Call for new or changing lesions.  Staying in the shade or wearing long sleeves, sun glasses (UVA+UVB protection) and wide brim hats (4-inch brim around the entire circumference of the hat) are also recommended for sun protection.    Melanoma ABCDEs  Melanoma is the most dangerous type of skin cancer, and is the leading cause of death from skin disease.  You are more likely to develop melanoma if you: Have light-colored skin, light-colored eyes, or red or blond hair Spend a lot of time in the sun Tan regularly, either outdoors or in a tanning bed Have had blistering sunburns, especially during childhood Have a close family member who has had a melanoma Have atypical moles or large birthmarks  Early detection of melanoma is key since treatment is typically straightforward and cure rates are extremely high if we catch it early.   The first sign of melanoma is often a change in a mole or a new dark spot.  The ABCDE system is a way of remembering the signs of melanoma.  A for asymmetry:  The two halves do not match. B for border:  The edges of the growth are irregular. C for color:  A mixture of colors are present instead of an even brown color. D for diameter:  Melanomas are usually (but not always) greater than 6mm - the size of a pencil eraser. E for evolution:  The spot keeps changing in size, shape, and color.  Please check your skin once per month between visits. You can use a small mirror in front and a large mirror behind you to keep an eye on the back side or your body.   If you see any new or changing lesions before your next follow-up, please call to schedule a visit.  Please continue daily skin protection including broad spectrum sunscreen SPF 30+ to sun-exposed areas, reapplying every 2 hours as needed when you're outdoors.   Staying in the shade or wearing long sleeves, sun  glasses (UVA+UVB protection) and wide brim hats (4-inch brim around the entire circumference of the hat) are also recommended for sun protection.    Due to recent changes in healthcare laws, you may see results of your pathology and/or laboratory studies on MyChart before the doctors have had a chance to review them. We understand that in some cases there may be results that are confusing or concerning to you. Please understand that not all results are received at the same time and often the doctors may need to interpret multiple results in order to provide you with the best plan of care or course of treatment. Therefore, we ask that you please give us 2 business days to thoroughly review all your results before contacting the office for clarification. Should we see a critical lab result, you will be contacted sooner.   If You Need Anything After Your Visit  If you have any questions or concerns for your doctor, please call our main line at [phone] and press option 4 to reach your doctor's medical assistant. If no one answers, please leave a voicemail as directed and we will return your call as soon as possible. Messages left after 4 pm will be answered the following business day.   You may also send us a message via MyChart. We typically respond to MyChart messages within 1-2 business days.  For prescription refills, please ask your pharmacy to contact our   office. Our fax number is [phone].  If you have an urgent issue when the clinic is closed that cannot wait until the next business day, you can page your doctor at the number below.    Please note that while we do our best to be available for urgent issues outside of office hours, we are not available 24/7.   If you have an urgent issue and are unable to reach us, you may choose to seek medical care at your doctor's office, retail clinic, urgent care center, or emergency room.  If you have a medical emergency, please immediately call  911 or go to the emergency department.  Pager Numbers  - Dr. Kowalski: [phone]  - Dr. Moye: [phone]  - Dr. Stewart: [phone]  In the event of inclement weather, please call our main line at [phone] for an update on the status of any delays or closures.  Dermatology Medication Tips: Please keep the boxes that topical medications come in in order to help keep track of the instructions about where and how to use these. Pharmacies typically print the medication instructions only on the boxes and not directly on the medication tubes.   If your medication is too expensive, please contact our office at [phone] option 4 or send us a message through MyChart.   We are unable to tell what your co-pay for medications will be in advance as this is different depending on your insurance coverage. However, we may be able to find a substitute medication at lower cost or fill out paperwork to get insurance to cover a needed medication.   If a prior authorization is required to get your medication covered by your insurance company, please allow us 1-2 business days to complete this process.  Drug prices often vary depending on where the prescription is filled and some pharmacies may offer cheaper prices.  The website www.goodrx.com contains coupons for medications through different pharmacies. The prices here do not account for what the cost may be with help from insurance (it may be cheaper with your insurance), but the website can give you the price if you did not use any insurance.  - You can print the associated coupon and take it with your prescription to the pharmacy.  - You may also stop by our office during regular business hours and pick up a GoodRx coupon card.  - If you need your prescription sent electronically to a different pharmacy, notify our office through  MyChart or by phone at [phone] option 4.     Si Usted Necesita Algo Despus de Su  Visita  Tambin puede enviarnos un mensaje a travs de MyChart. Por lo general respondemos a los mensajes de MyChart en el transcurso de 1 a 2 das hbiles.  Para renovar recetas, por favor pida a su farmacia que se ponga en contacto con nuestra oficina. Nuestro nmero de fax es el [phone].  Si tiene un asunto urgente cuando la clnica est cerrada y que no puede esperar hasta el siguiente da hbil, puede llamar/localizar a su doctor(a) al nmero que aparece a continuacin.   Por favor, tenga en cuenta que aunque hacemos todo lo posible para estar disponibles para asuntos urgentes fuera del horario de oficina, no estamos disponibles las 24 horas del da, los 7 das de la semana.   Si tiene un problema urgente y no puede comunicarse con nosotros, puede optar por buscar atencin mdica  en el consultorio de su doctor(a), en una clnica privada, en   un centro de atencin urgente o en una sala de emergencias.  Si tiene una emergencia mdica, por favor llame inmediatamente al 911 o vaya a la sala de emergencias.  Nmeros de bper  - Dr. Kowalski: [phone]  - Dra. Moye: [phone]  - Dra. Stewart: [phone]  En caso de inclemencias del tiempo, por favor llame a nuestra lnea principal al [phone] para una actualizacin sobre el estado de cualquier retraso o cierre.  Consejos para la medicacin en dermatologa: Por favor, guarde las cajas en las que vienen los medicamentos de uso tpico para ayudarle a seguir las instrucciones sobre dnde y cmo usarlos. Las farmacias generalmente imprimen las instrucciones del medicamento slo en las cajas y no directamente en los tubos del medicamento.   Si su medicamento es muy caro, por favor, pngase en contacto con nuestra oficina llamando al [phone] y presione la opcin 4 o envenos un mensaje a travs de MyChart.   No podemos decirle cul ser su copago por los medicamentos por adelantado ya que esto es diferente dependiendo de la  cobertura de su seguro. Sin embargo, es posible que podamos encontrar un medicamento sustituto a menor costo o llenar un formulario para que el seguro cubra el medicamento que se considera necesario.   Si se requiere una autorizacin previa para que su compaa de seguros cubra su medicamento, por favor permtanos de 1 a 2 das hbiles para completar este proceso.  Los precios de los medicamentos varan con frecuencia dependiendo del lugar de dnde se surte la receta y alguna farmacias pueden ofrecer precios ms baratos.  El sitio web www.goodrx.com tiene cupones para medicamentos de diferentes farmacias. Los precios aqu no tienen en cuenta lo que podra costar con la ayuda del seguro (puede ser ms barato con su seguro), pero el sitio web puede darle el precio si no utiliz ningn seguro.  - Puede imprimir el cupn correspondiente y llevarlo con su receta a la farmacia.  - Tambin puede pasar por nuestra oficina durante el horario de atencin regular y recoger una tarjeta de cupones de GoodRx.  - Si necesita que su receta se enve electrnicamente a una farmacia diferente, informe a nuestra oficina a travs de MyChart de [place] o por telfono llamando al [phone] y presione la opcin 4.  

## 2022-12-19 NOTE — Progress Notes (Signed)
   New Patient Visit   Subjective  Kristy Robertson is a 27 y.o. female who presents for the following: Skin Cancer Screening and Full Body Skin Exam. No personal Hx of dysplastic nevi. Has a couple of moles of concern. No family Hx of MM. Sister has had dysplastic nevi.  The patient presents for Total-Body Skin Exam (TBSE) for skin cancer screening and mole check. The patient has spots, moles and lesions to be evaluated, some may be new or changing and the patient has concerns that these could be cancer.    The following portions of the chart were reviewed this encounter and updated as appropriate: medications, allergies, medical history  Review of Systems:  No other skin or systemic complaints except as noted in HPI or Assessment and Plan.  Objective  Well appearing patient in no apparent distress; mood and affect are within normal limits.  A full examination was performed including scalp, head, eyes, ears, nose, lips, neck, chest, axillae, abdomen, back, buttocks, bilateral upper extremities, bilateral lower extremities, hands, feet, fingers, toes, fingernails, and toenails. All findings within normal limits unless otherwise noted below.   Relevant physical exam findings are noted in the Assessment and Plan.     Assessment & Plan   LENTIGINES Exam: scattered tan macules Due to sun exposure Treatment Plan: Benign-appearing, observe. Recommend daily broad spectrum sunscreen SPF 30+ to sun-exposed areas, reapply every 2 hours as needed.  Call for any changes   MELANOCYTIC NEVI - Tan-brown and/or pink-flesh-colored symmetric macules and papules - Benign appearing on exam today - Observation - Call clinic for new or changing moles - Recommend daily use of broad spectrum spf 30+ sunscreen to sun-exposed areas.   HEMANGIOMA Exam: 3 mm red papule at right postauricular scalp. Discussed benign nature. Discussed shave removal if irritated.  Recommend observation. Call for  changes.   MELANOCYTIC NEVI  Exam: 6 mm brown macule with darker edge, spinal lower back. 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 See photo.  4 mm two-toned brown macule at inferior umbilicus. See photo.  3 mm pink-tan papule with adjacent brown macule at left forearm  3 mm medium dark brown macule with slight irregular pigment at right chest  Foot at R-2 webspace 2 mm medium-dark brown macule  Benign appearing on exam today. Recommend observation. Call clinic for new or changing moles. Recommend daily use of broad spectrum spf 30+ sunscreen to sun-exposed areas.   SKIN CANCER SCREENING PERFORMED TODAY.    Return in about 1 year (around 12/19/2023) for TBSE.  I, Lawson Radar, CMA, am acting as scribe for Willeen Niece, MD.   Documentation: I have reviewed the above documentation for accuracy and completeness, and I agree with the above.  Willeen Niece, MD

## 2023-12-24 ENCOUNTER — Ambulatory Visit (INDEPENDENT_AMBULATORY_CARE_PROVIDER_SITE_OTHER): Payer: No Typology Code available for payment source | Admitting: Dermatology

## 2023-12-24 ENCOUNTER — Encounter: Payer: Self-pay | Admitting: Dermatology

## 2023-12-24 DIAGNOSIS — D225 Melanocytic nevi of trunk: Secondary | ICD-10-CM

## 2023-12-24 DIAGNOSIS — D2262 Melanocytic nevi of left upper limb, including shoulder: Secondary | ICD-10-CM

## 2023-12-24 DIAGNOSIS — D1801 Hemangioma of skin and subcutaneous tissue: Secondary | ICD-10-CM

## 2023-12-24 DIAGNOSIS — L731 Pseudofolliculitis barbae: Secondary | ICD-10-CM

## 2023-12-24 DIAGNOSIS — D2271 Melanocytic nevi of right lower limb, including hip: Secondary | ICD-10-CM

## 2023-12-24 DIAGNOSIS — L814 Other melanin hyperpigmentation: Secondary | ICD-10-CM

## 2023-12-24 DIAGNOSIS — Z1283 Encounter for screening for malignant neoplasm of skin: Secondary | ICD-10-CM | POA: Diagnosis not present

## 2023-12-24 DIAGNOSIS — D229 Melanocytic nevi, unspecified: Secondary | ICD-10-CM

## 2023-12-24 DIAGNOSIS — L689 Hypertrichosis, unspecified: Secondary | ICD-10-CM

## 2023-12-24 MED ORDER — ADAPALENE 0.3 % EX GEL: CUTANEOUS | 5 refills | Status: AC

## 2023-12-24 NOTE — Patient Instructions (Addendum)
 Start Differin/Adapalene 0.3% gel pea-sized amount at bedtime, wash off in morning.  Topical retinoid medications like tretinoin/Retin-A, adapalene/Differin, tazarotene/Fabior, and Epiduo/Epiduo Forte can cause dryness and irritation when first started. Only apply a pea-sized amount to the entire affected area. Avoid applying it around the eyes, edges of mouth and creases at the nose. If you experience irritation, use a good moisturizer first and/or apply the medicine less often. If you are doing well with the medicine, you can increase how often you use it until you are applying every night. Be careful with sun protection while using this medication as it can make you sensitive to the sun. This medicine should not be used by pregnant women.      Recommend daily broad spectrum sunscreen with zinc SPF 30+ to sun-exposed areas, reapply every 2 hours as needed. Call for new or changing lesions.  Staying in the shade or wearing long sleeves, sun glasses (UVA+UVB protection) and wide brim hats (4-inch brim around the entire circumference of the hat) are also recommended for sun protection.       Melanoma ABCDEs  Melanoma is the most dangerous type of skin cancer, and is the leading cause of death from skin disease.  You are more likely to develop melanoma if you: Have light-colored skin, light-colored eyes, or red or blond hair Spend a lot of time in the sun Tan regularly, either outdoors or in a tanning bed Have had blistering sunburns, especially during childhood Have a close family member who has had a melanoma Have atypical moles or large birthmarks  Early detection of melanoma is key since treatment is typically straightforward and cure rates are extremely high if we catch it early.   The first sign of melanoma is often a change in a mole or a new dark spot.  The ABCDE system is a way of remembering the signs of melanoma.  A for asymmetry:  The two halves do not match. B for border:  The  edges of the growth are irregular. C for color:  A mixture of colors are present instead of an even brown color. D for diameter:  Melanomas are usually (but not always) greater than 6mm - the size of a pencil eraser. E for evolution:  The spot keeps changing in size, shape, and color.  Please check your skin once per month between visits. You can use a small mirror in front and a large mirror behind you to keep an eye on the back side or your body.   If you see any new or changing lesions before your next follow-up, please call to schedule a visit.  Please continue daily skin protection including broad spectrum sunscreen SPF 30+ to sun-exposed areas, reapplying every 2 hours as needed when you're outdoors.   Staying in the shade or wearing long sleeves, sun glasses (UVA+UVB protection) and wide brim hats (4-inch brim around the entire circumference of the hat) are also recommended for sun protection.      Due to recent changes in healthcare laws, you may see results of your pathology and/or laboratory studies on MyChart before the doctors have had a chance to review them. We understand that in some cases there may be results that are confusing or concerning to you. Please understand that not all results are received at the same time and often the doctors may need to interpret multiple results in order to provide you with the best plan of care or course of treatment. Therefore, we ask that you  please give [place] 2 business days to thoroughly review all your results before contacting the office for clarification. Should we see a critical lab result, you will be contacted sooner.   If You Need Anything After Your Visit  If you have any questions or concerns for your doctor, please call our main line at [phone] and press option 4 to reach your doctor's medical assistant. If no one answers, please leave a voicemail as directed and we will return your call as soon as possible. Messages left after 4 pm  will be answered the following business day.   You may also send [place] a message via MyChart. We typically respond to MyChart messages within 1-2 business days.  For prescription refills, please ask your pharmacy to contact our office. Our fax number is [phone].  If you have an urgent issue when the clinic is closed that cannot wait until the next business day, you can page your doctor at the number below.    Please note that while we do our best to be available for urgent issues outside of office hours, we are not available 24/7.   If you have an urgent issue and are unable to reach [place], you may choose to seek medical care at your doctor's office, retail clinic, urgent care center, or emergency room.  If you have a medical emergency, please immediately call 911 or go to the emergency department.  Pager Numbers  - Dr. Gwen Pounds: [phone]  - Dr. Roseanne Reno: [phone]  - Dr. Katrinka Blazing: [phone]   In the event of inclement weather, please call our main line at [phone] for an update on the status of any delays or closures.  Dermatology Medication Tips: Please keep the boxes that topical medications come in in order to help keep track of the instructions about where and how to use these. Pharmacies typically print the medication instructions only on the boxes and not directly on the medication tubes.   If your medication is too expensive, please contact our office at [phone] option 4 or send [place] a message through MyChart.   We are unable to tell what your co-pay for medications will be in advance as this is different depending on your insurance coverage. However, we may be able to find a substitute medication at lower cost or fill out paperwork to get insurance to cover a needed medication.   If a prior authorization is required to get your medication covered by your insurance company, please allow [place] 1-2 business days to complete this process.  Drug prices often vary  depending on where the prescription is filled and some pharmacies may offer cheaper prices.  The website www.goodrx.com contains coupons for medications through different pharmacies. The prices here do not account for what the cost may be with help from insurance (it may be cheaper with your insurance), but the website can give you the price if you did not use any insurance.  - You can print the associated coupon and take it with your prescription to the pharmacy.  - You may also stop by our office during regular business hours and pick up a GoodRx coupon card.  - If you need your prescription sent electronically to a different pharmacy, notify our office through [facility] or by phone at [phone] option 4.     Si Usted Necesita Algo Despus de Su Visita  Tambin puede enviarnos un mensaje a travs de Clinical cytogeneticist. Por lo general respondemos a los mensajes de Psychologist, sport and exercise transcurso  de 1 a 2 das hbiles.  Para renovar recetas, por favor pida a su farmacia que se ponga en contacto con nuestra oficina. Franz Jacks de fax es [place] [phone].  Si tiene un asunto urgente cuando la clnica est cerrada y que no puede esperar hasta el siguiente da hbil, puede llamar/localizar a su doctor(a) al nmero que aparece a continuacin.   Por favor, tenga en cuenta que aunque hacemos todo lo posible para estar disponibles para asuntos urgentes fuera del horario de [place], no estamos disponibles las 24 horas del da, los 7 [address]  [address] de la [place].   Si tiene un problema urgente y no puede comunicarse con nosotros, puede optar por buscar atencin mdica  en el consultorio de su doctor(a), en una clnica privada, en un centro de atencin urgente o en una sala de emergencias.  Si tiene Engineer, drilling, por favor llame inmediatamente al 911 o vaya a la sala de emergencias.  Nmeros de bper  - Dr. Bary Likes: [phone]  - Dra. Annette Barters: [phone]  - Dr. Felipe Horton: [phone]   En caso de  inclemencias del tiempo, por favor llame a Lajuan Pila principal al [phone] para una actualizacin sobre el [place] de cualquier retraso o cierre.  Consejos para la medicacin en dermatologa: Por favor, guarde las cajas en las que vienen los medicamentos de uso tpico para ayudarle a seguir las instrucciones sobre dnde y cmo usarlos. Las farmacias generalmente imprimen las instrucciones del medicamento slo en las cajas y no directamente en los tubos del [place].   Si su medicamento es muy caro, por favor, pngase en contacto con Bettyjane Brunet llamando al [phone] y presione la opcin 4 o envenos un mensaje a travs de Clinical cytogeneticist.   No podemos decirle cul ser su copago por los medicamentos por adelantado ya que esto es diferente dependiendo de la cobertura de su seguro. Sin embargo, es posible que podamos encontrar un medicamento sustituto a Audiological scientist un formulario para que el seguro cubra el medicamento que se considera necesario.   Si se requiere una autorizacin previa para que su compaa de seguros [place] su medicamento, por favor permtanos de 1 a 2 das hbiles para completar este proceso.  Los precios de los medicamentos varan con frecuencia dependiendo del Environmental consultant de dnde se surte la receta y alguna farmacias pueden ofrecer precios ms baratos.  El sitio web www.goodrx.com tiene cupones para medicamentos de Health and safety inspector. Los precios aqu no tienen en cuenta lo que podra costar con la ayuda del seguro (puede ser ms barato con su seguro), pero el sitio web puede darle el precio si no utiliz Tourist information centre manager.  - Puede imprimir el cupn correspondiente y llevarlo con su receta a la farmacia.  - Tambin puede pasar por nuestra oficina durante el horario de atencin regular y Education officer, museum una tarjeta de cupones de GoodRx.  - Si necesita que su receta se enve electrnicamente a una farmacia diferente, informe a nuestra oficina a travs de MyChart de [place] o  por telfono llamando al [phone] y presione la opcin 4.

## 2023-12-24 NOTE — Progress Notes (Signed)
 Follow-Up Visit   Subjective  Kristy Robertson is a 28 y.o. female who presents for the following: Skin Cancer Screening and Full Body Skin Exam. No personal Hx of dysplastic nevi or skin cancer. No family Hx of MM. Sister has had dysplastic nevi.   The patient presents for Total-Body Skin Exam (TBSE) for skin cancer screening and mole check. The patient has spots, moles and lesions to be evaluated, some may be new or changing and the patient may have concern these could be cancer.    The following portions of the chart were reviewed this encounter and updated as appropriate: medications, allergies, medical history  Review of Systems:  No other skin or systemic complaints except as noted in HPI or Assessment and Plan.  Objective  Well appearing patient in no apparent distress; mood and affect are within normal limits.  A full examination was performed including scalp, head, eyes, ears, nose, lips, neck, chest, axillae, abdomen, back, buttocks, bilateral upper extremities, bilateral lower extremities, hands, feet, fingers, toes, fingernails, and toenails. All findings within normal limits unless otherwise noted below.   Relevant physical exam findings are noted in the Assessment and Plan.    Assessment & Plan   SKIN CANCER SCREENING PERFORMED TODAY.   LENTIGINES Exam: scattered tan macules Due to sun exposure Treatment Plan: Benign-appearing, observe. Recommend daily broad spectrum sunscreen SPF 30+ to sun-exposed areas, reapply every 2 hours as needed.  Call for any changes   MELANOCYTIC NEVI - Tan-brown and/or pink-flesh-colored symmetric macules and papules - 6 mm speckled brown macule with darker edge, spinal lower back. 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 See photo.   - 4 mm two-toned brown macule at inferior umbilicus. See photo.   - 3 mm pink-tan papule with adjacent brown macule at left  forearm   - 3 mm medium dark brown macule with slight irregular pigment at right chest   - Foot at R-2 webspace 2 mm medium-dark brown macule   Benign-appearing. Stable compared to previous visit. Observation.  Call clinic for new or changing moles.  Recommend daily use of broad spectrum spf 30+ sunscreen to sun-exposed areas.    HEMANGIOMA Exam: pedunculated red papule at right post auricular hair line Discussed benign nature. Discussed shave removal if gets irritated- pt defers. Recommend observation. Call for changes.   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   Chronic and persistent condition with duration or expected duration over one year. Condition is bothersome/symptomatic for patient. Currently flared.   Treatment: Avoid plucking, can try shaving. Recommend laser hair removal. Advised can take several treatments.  Discussed adding oral Spironolactone. Will not prevent hair growth, will minimize. Pt defers.  Start Differin/Adapalene 0.3% gel pea-sized amount at bedtime, wash off in morning.  Topical retinoid medications like tretinoin/Retin-A, adapalene/Differin, tazarotene/Fabior, and Epiduo/Epiduo Forte can cause dryness and irritation when first started. Only apply a pea-sized amount to the entire affected area. Avoid applying it around the eyes, edges of mouth and creases at the nose. If you experience irritation, use a good moisturizer first and/or apply the medicine less often. If you are doing well with the medicine, you can increase how often you use it until you are applying every night. Be careful with sun protection while using this medication as it can make you sensitive  to the sun. This medicine should not be used by pregnant women.    Recommend topical benzoyl peroxide gel to spot treat in morning or use a benzoyl peroxide wash.    Return in  about 1 year (around 12/23/2024) for TBSE.  I, Darcie Easterly, CMA, am acting as scribe for Artemio Larry, MD.   Documentation: I have reviewed the above documentation for accuracy and completeness, and I agree with the above.  Artemio Larry, MD

## 2025-01-11 ENCOUNTER — Encounter: Admitting: Dermatology
